# Patient Record
Sex: Male | Born: 1935 | Race: White | Hispanic: No | Marital: Single | Smoking: Never smoker
Health system: Southern US, Community
[De-identification: ages and names within clinical notes are randomized; demographics above are authoritative.]

## PROBLEM LIST (undated history)

## (undated) HISTORY — PX: PACEMAKER IMPLANT: EP1218

---

## 2004-01-31 ENCOUNTER — Encounter: Admission: RE | Admit: 2004-01-31 | Discharge: 2004-01-31 | Payer: Self-pay | Admitting: Internal Medicine

## 2004-08-16 ENCOUNTER — Encounter: Admission: RE | Admit: 2004-08-16 | Discharge: 2004-08-16 | Payer: Self-pay | Admitting: Internal Medicine

## 2009-03-21 ENCOUNTER — Encounter: Admission: RE | Admit: 2009-03-21 | Discharge: 2009-03-21 | Payer: Self-pay | Admitting: Internal Medicine

## 2009-08-22 ENCOUNTER — Encounter: Admission: RE | Admit: 2009-08-22 | Discharge: 2009-08-22 | Payer: Self-pay | Admitting: Internal Medicine

## 2013-08-21 ENCOUNTER — Other Ambulatory Visit: Payer: Self-pay | Admitting: Internal Medicine

## 2013-08-21 ENCOUNTER — Ambulatory Visit
Admission: RE | Admit: 2013-08-21 | Discharge: 2013-08-21 | Disposition: A | Discharge status: Home / Self Care | Payer: Medicare Other | Source: Ambulatory Visit | Attending: Internal Medicine | Admitting: Internal Medicine

## 2013-08-21 DIAGNOSIS — R05 Cough: Secondary | ICD-10-CM

## 2013-08-21 DIAGNOSIS — R059 Cough, unspecified: Secondary | ICD-10-CM

## 2016-09-06 ENCOUNTER — Other Ambulatory Visit: Payer: Self-pay | Admitting: Internal Medicine

## 2016-09-06 DIAGNOSIS — R6889 Other general symptoms and signs: Secondary | ICD-10-CM

## 2016-09-14 ENCOUNTER — Ambulatory Visit
Admission: RE | Admit: 2016-09-14 | Discharge: 2016-09-14 | Disposition: A | Discharge status: Home / Self Care | Payer: Medicare Other | Source: Ambulatory Visit | Attending: Internal Medicine | Admitting: Internal Medicine

## 2016-09-14 DIAGNOSIS — R6889 Other general symptoms and signs: Secondary | ICD-10-CM

## 2019-10-19 ENCOUNTER — Other Ambulatory Visit: Payer: Self-pay

## 2019-10-19 ENCOUNTER — Ambulatory Visit
Admission: RE | Admit: 2019-10-19 | Discharge: 2019-10-19 | Disposition: A | Discharge status: Home / Self Care | Payer: Medicare Other | Source: Ambulatory Visit | Attending: Internal Medicine | Admitting: Internal Medicine

## 2019-10-19 ENCOUNTER — Other Ambulatory Visit: Payer: Self-pay | Admitting: Internal Medicine

## 2019-10-19 DIAGNOSIS — R0781 Pleurodynia: Secondary | ICD-10-CM

## 2019-10-19 DIAGNOSIS — W19XXXA Unspecified fall, initial encounter: Secondary | ICD-10-CM

## 2019-10-20 ENCOUNTER — Other Ambulatory Visit: Payer: Self-pay | Admitting: Internal Medicine

## 2021-02-21 IMAGING — CR DG RIBS 2V*L*
2 series · 2 of 2 positions shown · non-contrast
Comparison: Chest x-ray from earlier in the same day.

CLINICAL DATA: Recent fall with left-sided chest pain, initial
encounter

EXAM:
LEFT RIBS - 2 VIEW

[w ribs ap upper left]
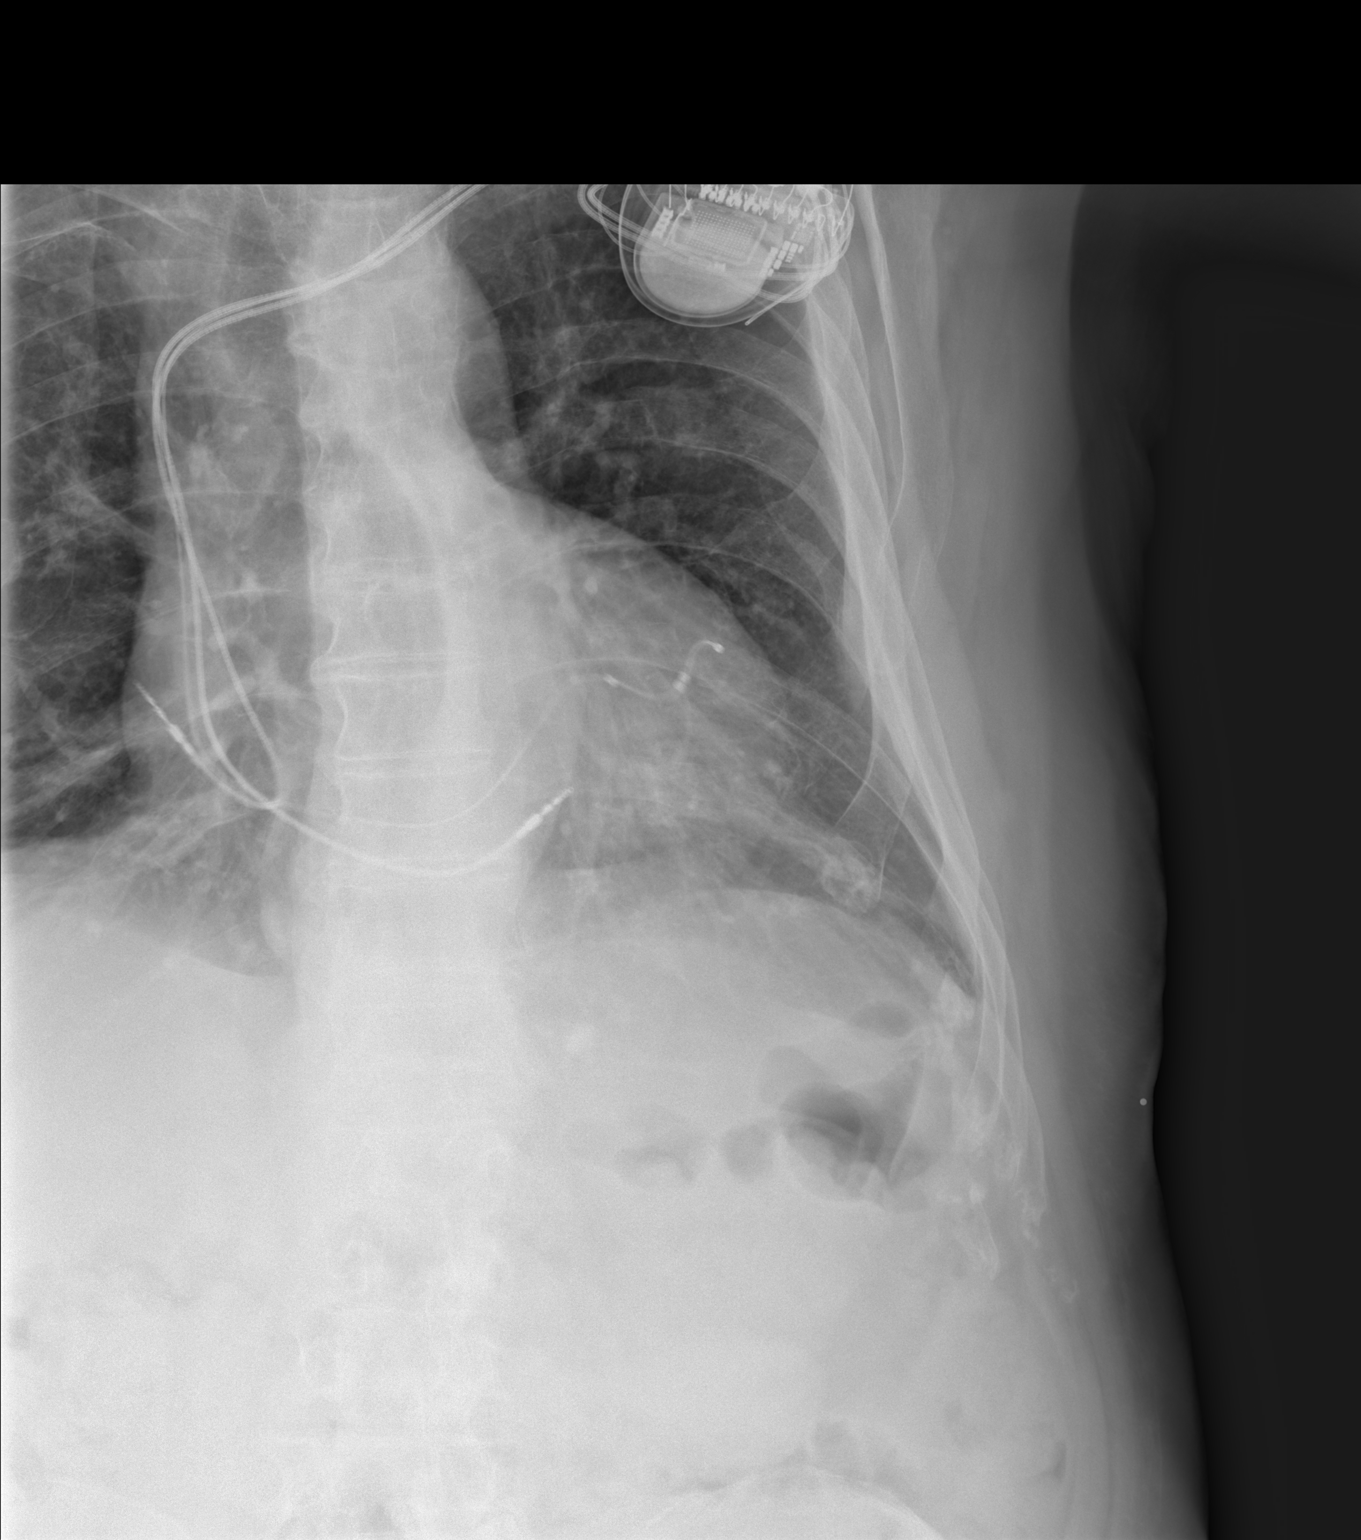

[w ribs ap lower left]
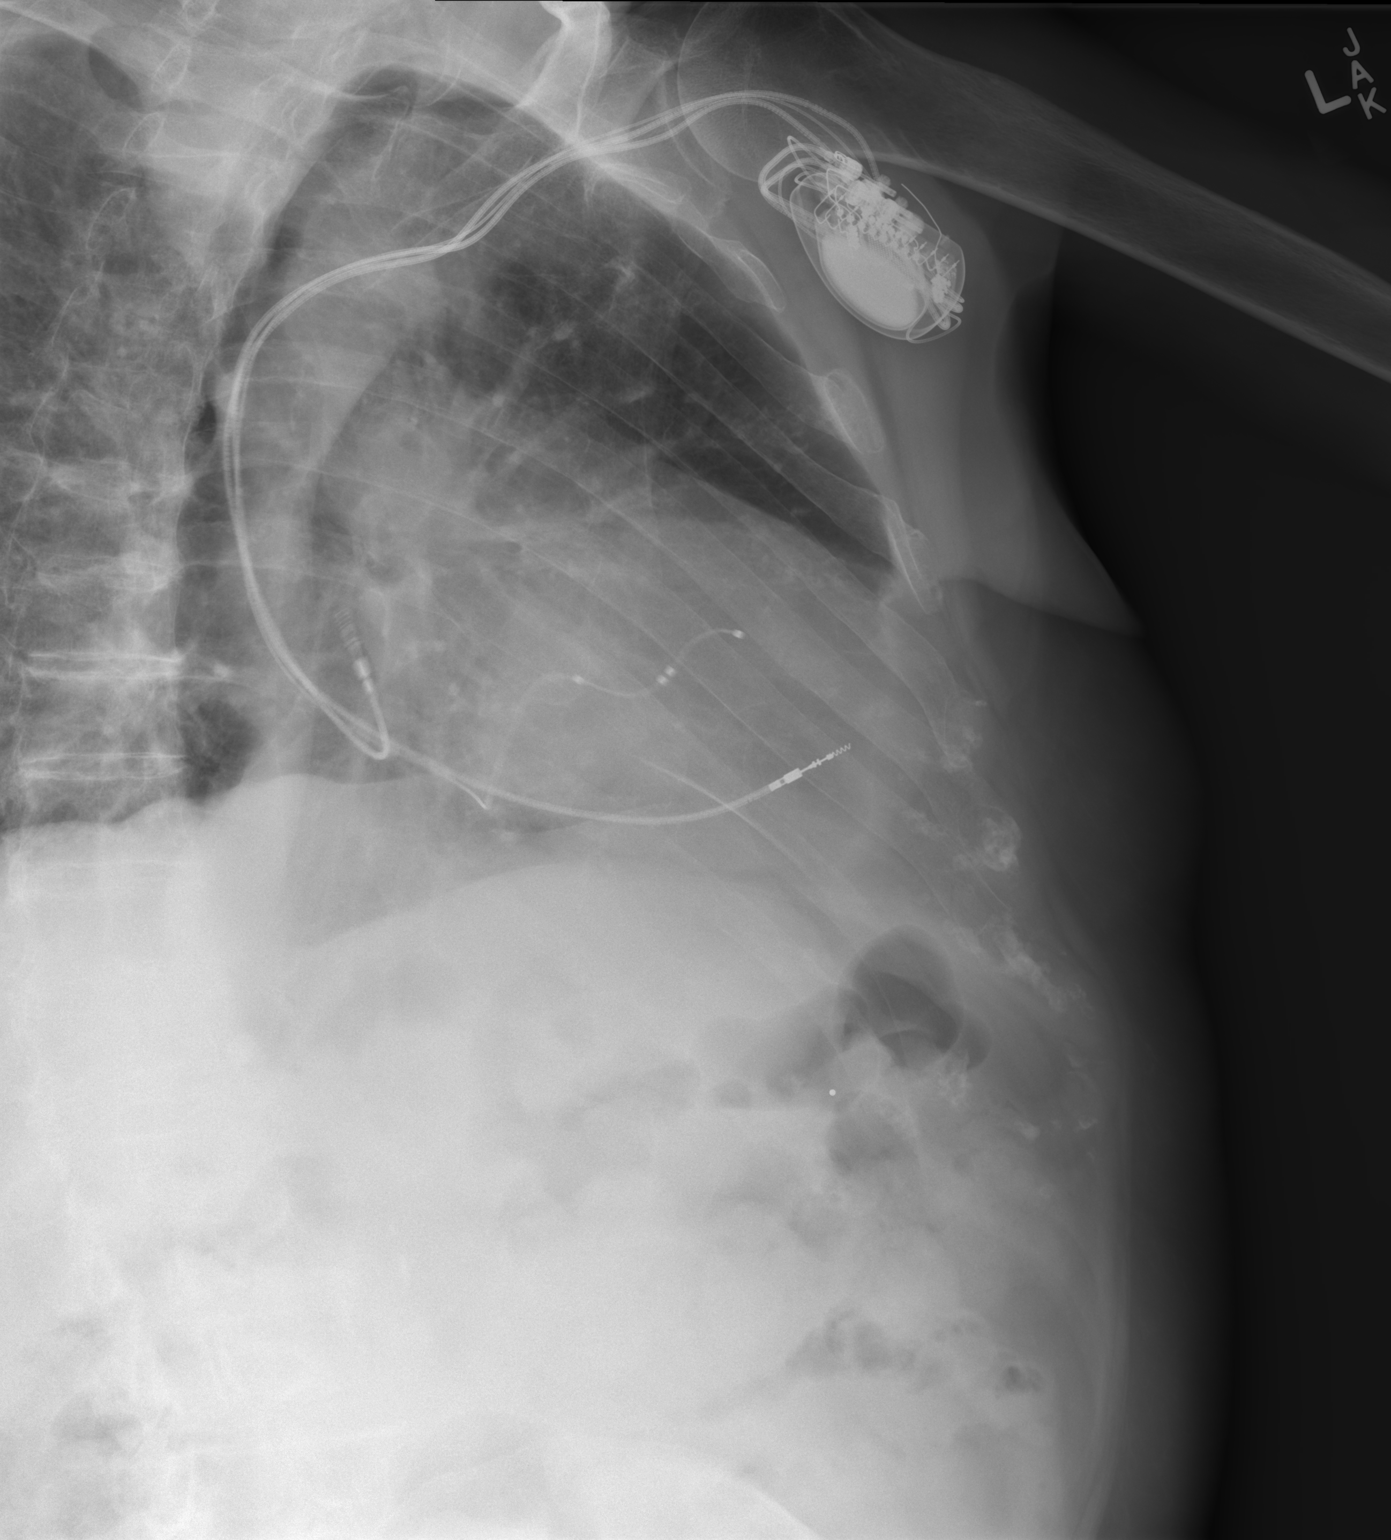

[2 of 2 positions shown; findings below may reference images not displayed]

FINDINGS: No fracture or other bone lesions are seen involving the ribs.
IMPRESSION: No acute abnormality noted.

## 2021-09-28 ENCOUNTER — Ambulatory Visit: Payer: Self-pay

## 2021-09-28 ENCOUNTER — Encounter: Payer: Self-pay | Admitting: Surgery

## 2021-09-28 ENCOUNTER — Ambulatory Visit (INDEPENDENT_AMBULATORY_CARE_PROVIDER_SITE_OTHER): Payer: Medicare Other | Admitting: Surgery

## 2021-09-28 ENCOUNTER — Telehealth: Payer: Self-pay

## 2021-09-28 ENCOUNTER — Ambulatory Visit (HOSPITAL_COMMUNITY)
Admission: RE | Admit: 2021-09-28 | Discharge: 2021-09-28 | Disposition: A | Discharge status: Home / Self Care | Payer: Medicare Other | Source: Ambulatory Visit | Attending: Surgery | Admitting: Surgery

## 2021-09-28 VITALS — BP 138/86 | Ht 66.5 in | Wt 193.6 lb

## 2021-09-28 DIAGNOSIS — M25562 Pain in left knee: Secondary | ICD-10-CM

## 2021-09-28 DIAGNOSIS — M7989 Other specified soft tissue disorders: Secondary | ICD-10-CM

## 2021-09-28 DIAGNOSIS — M79662 Pain in left lower leg: Secondary | ICD-10-CM

## 2021-09-28 MED ORDER — BUPIVACAINE HCL 0.25 % IJ SOLN
6.0000 mL | INTRAMUSCULAR | Status: AC | PRN
  Administered 2021-09-28: 6 mL via INTRA_ARTICULAR

## 2021-09-28 MED ORDER — LIDOCAINE HCL 1 % IJ SOLN
3.0000 mL | INTRAMUSCULAR | Status: AC | PRN
  Administered 2021-09-28: 3 mL

## 2021-09-28 MED ORDER — METHYLPREDNISOLONE ACETATE 40 MG/ML IJ SUSP
40.0000 mg | INTRAMUSCULAR | Status: AC | PRN
  Administered 2021-09-28: 40 mg via INTRA_ARTICULAR

## 2021-09-28 NOTE — Progress Notes (Addendum)
? ?Office Visit Note ?  ?Patient: Blake Valenzuela           ?Date of Birth: [birth year]           ?MRN: [id number] ?Visit Date: 09/28/2021 ?             ?Requested by: Jilda Panda, MD ?[id number] ?[place],  [place] [address] ?PCP: Jilda Panda, MD ? ? ?Assessment & Plan: ?Visit Diagnoses:  ?1. Acute pain of left knee   ?2. Pain of left calf   ?3. Pain and swelling of left lower leg   ? ? ?Plan: With patient's increased left calf pain and swelling after knee injury I recommend getting a venous Doppler to rule out DVT.  Patient scheduled to have this done this afternoon.  I will await callback report.  In hopes of giving him some improvement of his knee pain offered repeat injection.  I advised patient that he may get good relief today if this is done.  And patient sent left knee was prepped with Betadine and intra-articular Marcaine/Depo-Medrol injection was performed from an anteromedial approach.  Tolerated without complication.  Patient did report very good relief of his pain with anesthetic in place and gait also greatly improved.  Follow-up with me in 2 weeks for recheck.  If he still continues to have ongoing pain I may consider getting an MRI to rule out meniscal tear.  Patient does have a pacemaker. ? ?Follow-Up Instructions: Return in about 2 weeks (around 10/12/2021) for with [employer] recheck left knee after injection and venous doppler.  ? ?Orders:  ?Orders Placed This Encounter  ?Procedures  ? Large Joint Inj  ? XR KNEE 3 VIEW LEFT  ? VAS [place] LOWER EXTREMITY VENOUS (DVT)  ? ?No orders of the defined types were placed in this encounter. ? ? ? ? Procedures: ?Large Joint Inj: L knee on 09/28/2021 3:11 PM ?Indications: pain and joint swelling ?Details: 25 G 1.5 in needle, anteromedial approach ?Medications: 3 mL lidocaine 1 %; 6 mL bupivacaine 0.25 %; 40 mg methylPREDNISolone acetate 40 MG/ML ?Outcome: tolerated well, no immediate complications ?Consent was given by the patient. Patient was prepped and draped in the  usual sterile fashion.  ? ? ? ? ?Clinical Data: ?No additional findings. ? ? ?Subjective: ?Chief Complaint  ?Patient presents with  ? Left Knee - Pain  ? ? ?HPI ?88 year old white male who is new patient to clinic comes in today with complaints of left knee pain.  States that a few weeks ago he was walking to his bathroom when he twisted his knee and felt a "pop".  States that he did not fall.  Had pain and swelling in his knee.  Also complains of having some left calf pain and swelling down the lower leg as well.  He has been using crutches.  He did see his primary care provider on Monday who gave him an intra-articular Marcaine/Depo-Medrol injection.  States that he had minimal relief.  Patient did show me where the injection was performed and he had a bruise on the medial aspect of his knee that is in a much higher position than what I would have done. ?Review of Systems ?No current cardiopulmonary GI/GU issues ? ?Objective: ?Vital Signs: BP 138/86   Ht 5' 6.5" (1.689 m)   Wt 193 lb 9.6 oz (87.8 kg)   BMI 30.78 kg/m?  ? ?Physical Exam ?HENT:  ?   Head: Normocephalic and atraumatic.  ?   Nose: Nose normal.  ?  Eyes:  ?   Extraocular Movements: Extraocular movements intact.  ?Pulmonary:  ?   Effort: No respiratory distress.  ?Musculoskeletal:  ?   Comments: Gait is antalgic with crutches.  Patient has marked tenderness left medial joint line.  Slight discomfort with McMurray's testing.  Ligaments are stable.  He does have left knee swelling without large effusion.  He has moderate swelling left calf with 3+ pretib pitting edema.  Edema definitely worse on the left than the right.  Left calf is tender to palpation.  ?Neurological:  ?   Mental Status: He is alert and oriented to person, place, and time.  ?Psychiatric:     ?   Mood and Affect: Mood normal.  ? ? ?Ortho Exam ? ?Specialty Comments:  ?No specialty comments available. ? ?Imaging: ?No results found. ? ? ?PMFS History: ?There are no problems to display for  this patient. ? ?Social History  ? ?Occupational History  ? Not on file  ?Tobacco Use  ? Smoking status: Never  ? Smokeless tobacco: Never  ?Substance and Sexual Activity  ? Alcohol use: Not Currently  ? Drug use: Not on file  ? Sexual activity: Not on file  ? ? ? ?ADDENDUM ? ?This is an addendum to note from earlier.  Venous Doppler report from this afternoon showed" ? ?   ?Summary:  ?RIGHT:  ?- No evidence of common femoral vein obstruction.  ?   ?LEFT:  ?- There is no evidence of deep vein thrombosis in the lower extremity.  ?   ?- A cystic structure is found in the popliteal fossa.  ?   ? ?

## 2021-09-28 NOTE — Telephone Encounter (Signed)
[facility] called states Patient had U/S done. Neg for Dvt. States he has a Bakers Cyst. ? ? ?CB [phone] ?

## 2021-09-28 NOTE — Progress Notes (Signed)
Lower extremity venous has been completed.  ? ?Preliminary results in CV Proc.  ? ?Blake Valenzuela Krysia Zahradnik ?09/28/2021 4:15 PM    ?

## 2021-10-12 ENCOUNTER — Ambulatory Visit (INDEPENDENT_AMBULATORY_CARE_PROVIDER_SITE_OTHER): Payer: Medicare Other | Admitting: Surgery

## 2021-10-12 ENCOUNTER — Encounter: Payer: Self-pay | Admitting: Surgery

## 2021-10-12 VITALS — Ht 66.5 in | Wt 193.6 lb

## 2021-10-12 DIAGNOSIS — M25562 Pain in left knee: Secondary | ICD-10-CM | POA: Diagnosis not present

## 2021-10-12 NOTE — Progress Notes (Signed)
88 year old white male with history of left knee pain returns for recheck.  States that his left knee is doing very well after previous intra-articular Marcaine/Depo-Medrol injection.  He is very pleased.  Venous Doppler was also negative. ? ?Exam ?Very pleasant male alert and oriented in no acute distress.  He is ambulating much better.  Knee is nontender. ? ?Plan ?I will have patient follow-up with me in 2 months for recheck.  We did discuss possibly trying viscosupplementation in the future if his pain returns.  I would consider doing an intra-articular Marcaine/Depo-Medrol injections every 6 months if need be. ?

## 2021-12-14 ENCOUNTER — Ambulatory Visit: Payer: Medicare Other | Admitting: Surgery
